# Patient Record
Sex: Male | Born: 1975 | Race: White | Hispanic: No | Marital: Married | Smoking: Former smoker
Health system: Southern US, Community
[De-identification: ages and names within clinical notes are randomized; demographics above are authoritative.]

## PROBLEM LIST (undated history)

## (undated) HISTORY — PX: MASS EXCISION: SHX2000

---

## 2003-01-23 ENCOUNTER — Encounter: Payer: Self-pay | Admitting: Emergency Medicine

## 2003-01-23 ENCOUNTER — Emergency Department (HOSPITAL_COMMUNITY): Admission: EM | Admit: 2003-01-23 | Discharge: 2003-01-23 | Payer: Self-pay | Admitting: Emergency Medicine

## 2005-05-18 ENCOUNTER — Emergency Department (HOSPITAL_COMMUNITY): Admission: EM | Admit: 2005-05-18 | Discharge: 2005-05-18 | Payer: Self-pay | Admitting: Family Medicine

## 2006-05-14 ENCOUNTER — Emergency Department (HOSPITAL_COMMUNITY): Admission: EM | Admit: 2006-05-14 | Discharge: 2006-05-14 | Payer: Self-pay | Admitting: Emergency Medicine

## 2006-10-09 IMAGING — CR DG RIBS W/ CHEST 3+V*L*
3 series · 3 of 3 positions shown · non-contrast
Comparison: none

CLINICAL DATA: Acute injury with left lower rib pain and pleuritic chest pain.
 LEFT RIBS WITH PA CHEST ? 3 VIEWS ? 05/18/05:

[view not recorded (1 of 3)]
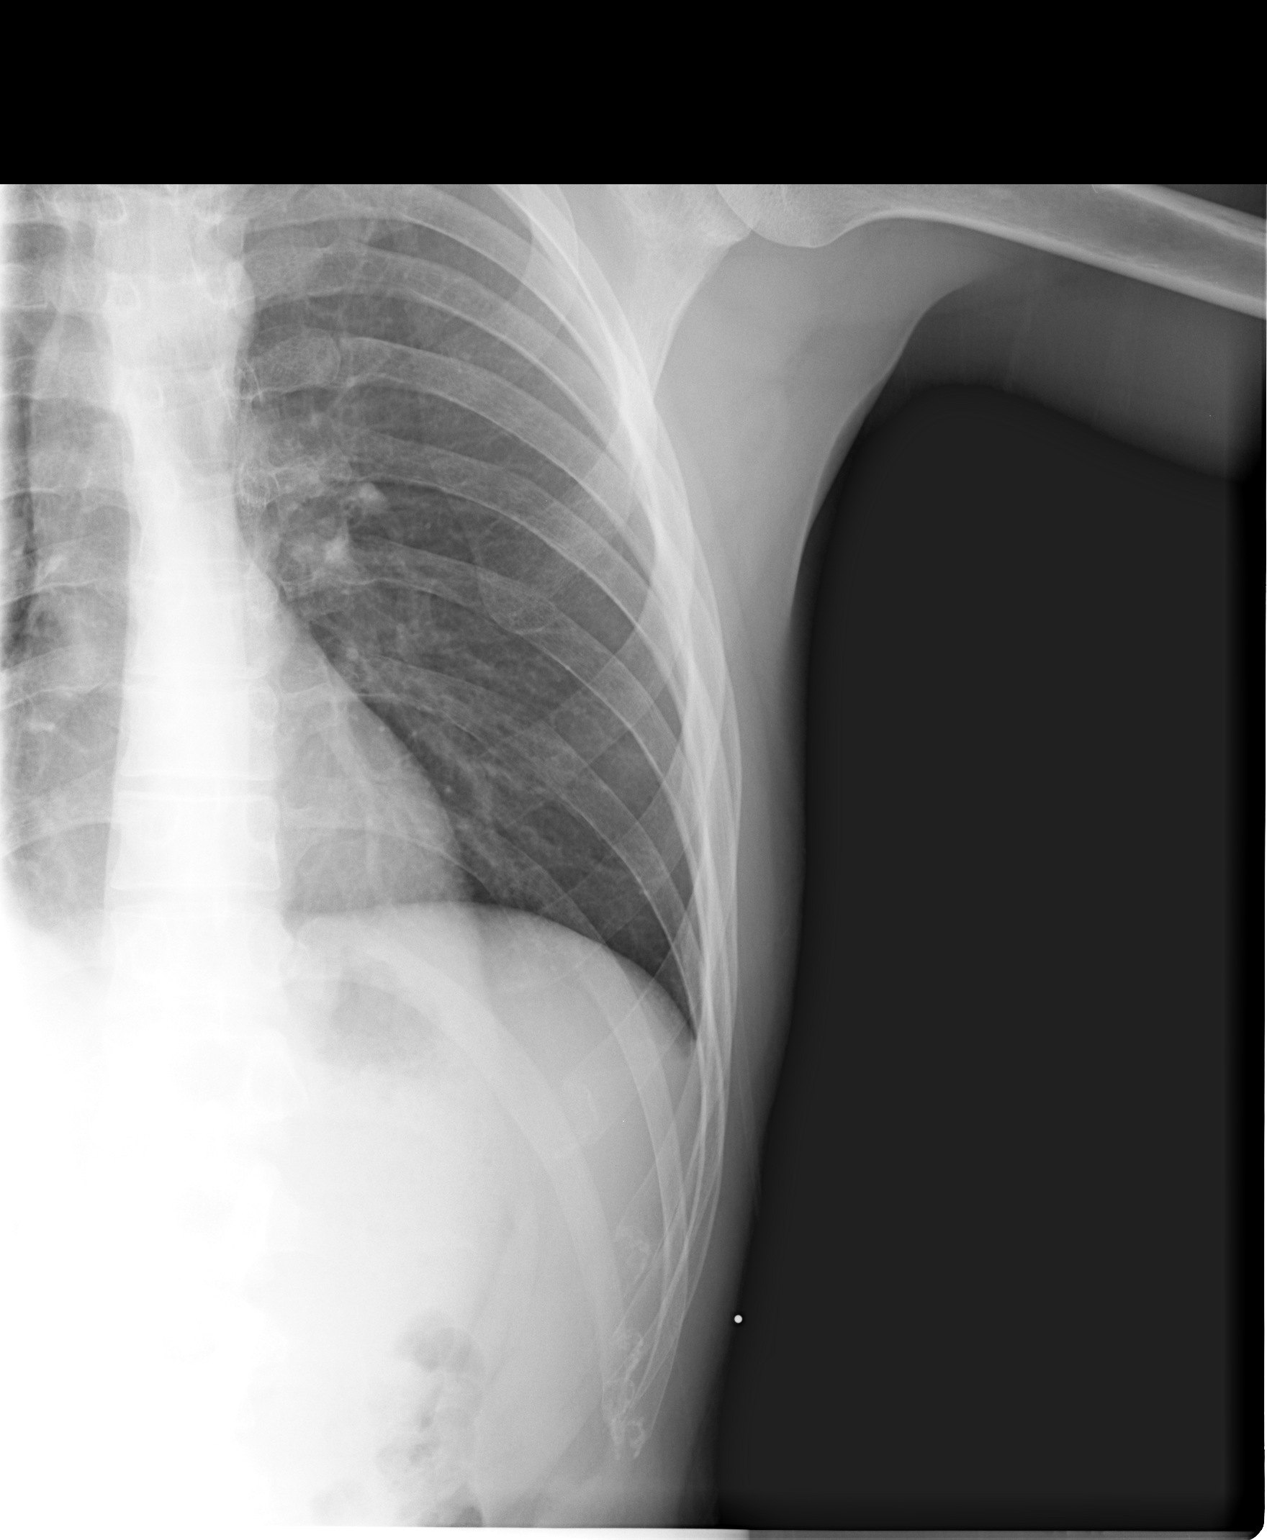

[view not recorded (2 of 3)]
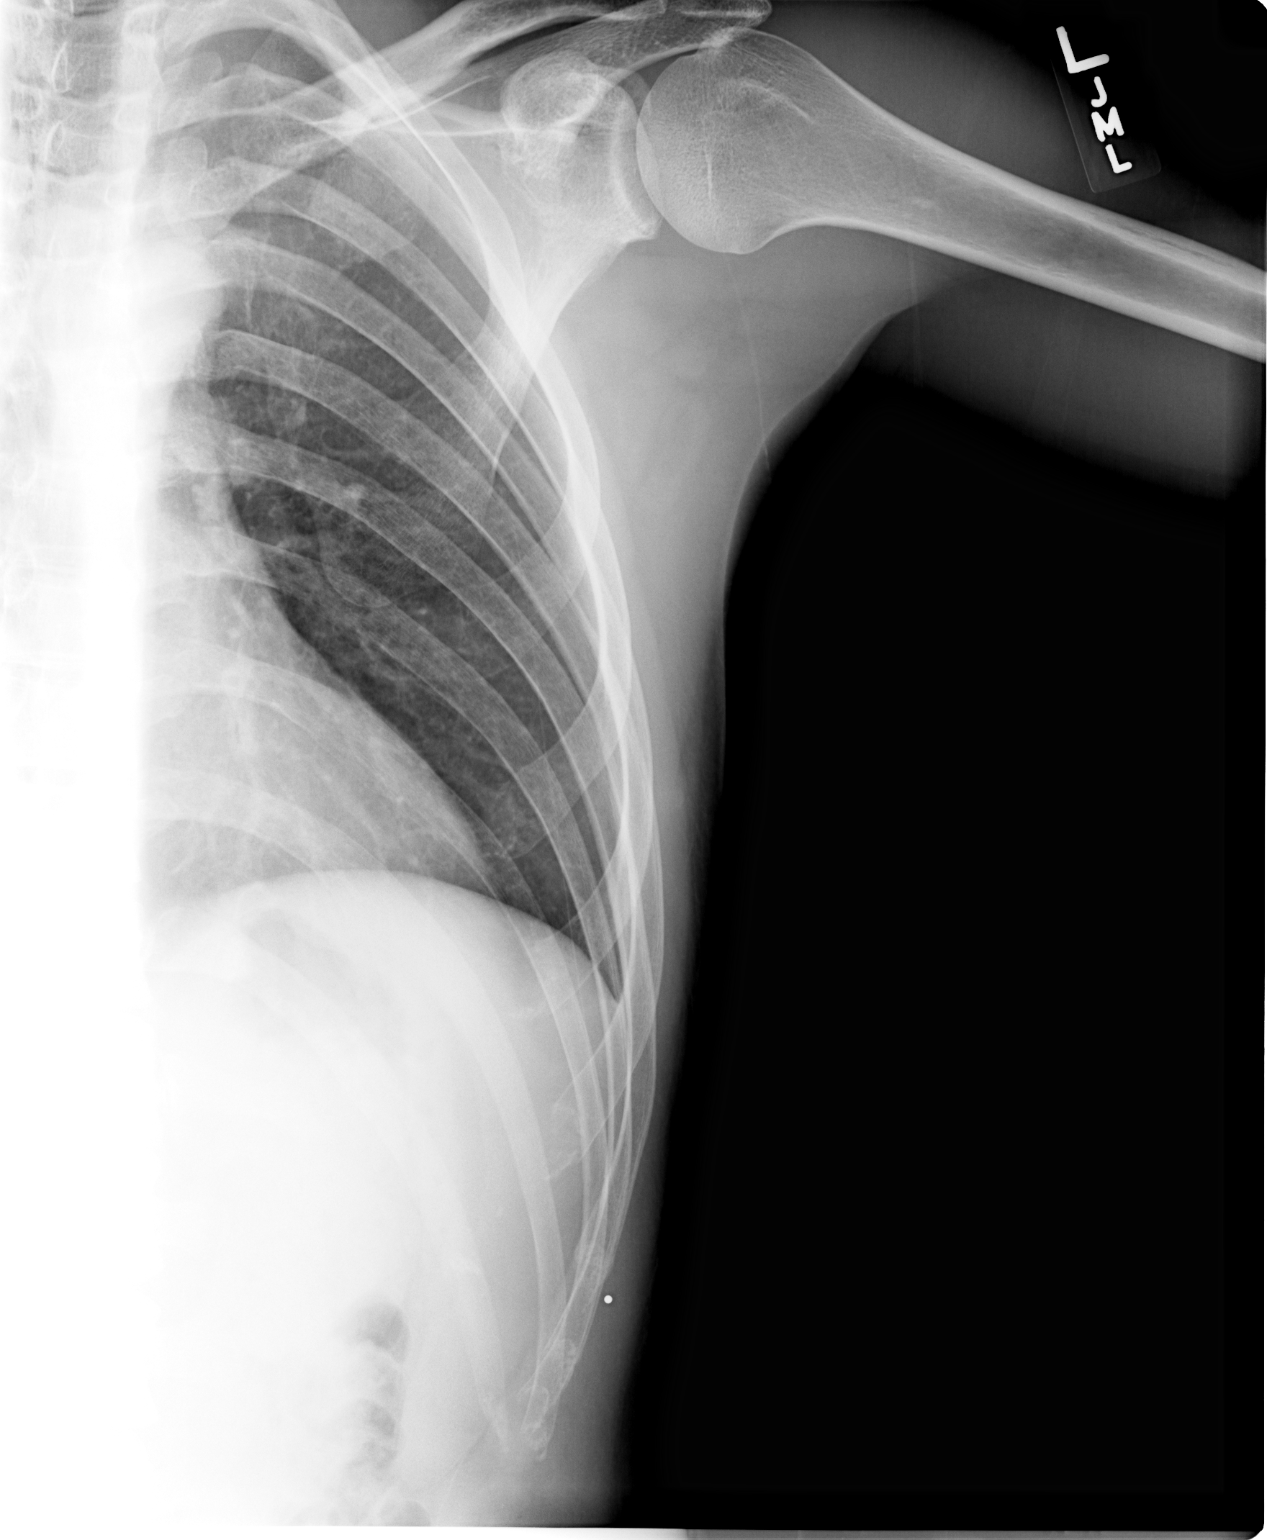

[view not recorded (3 of 3)]
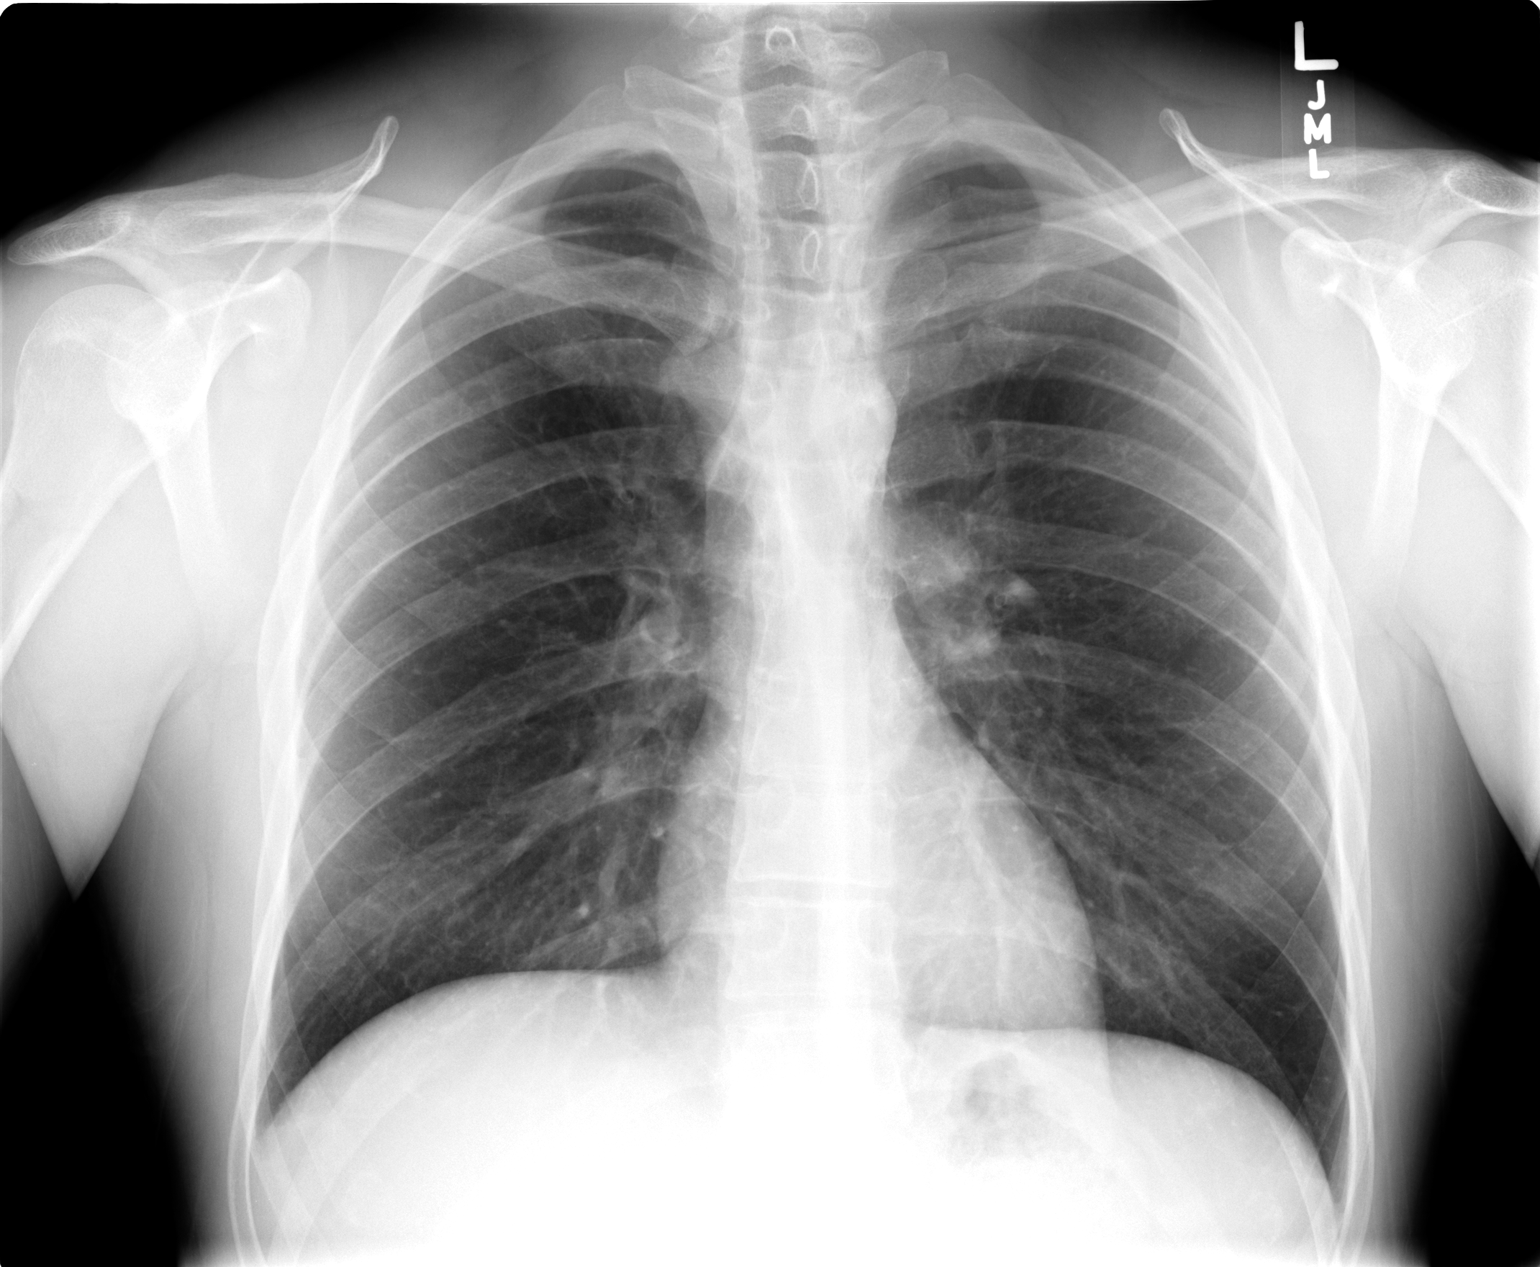

[3 of 3 positions shown; findings below may reference images not displayed]

FINDINGS: Frontal view of the chest as well as additional views of the left ribs demonstrate a probable nondisplaced fracture involving the distal aspect of the left ninth rib.  No associated pneumothorax or hemothorax.  No other abnormality.
IMPRESSION: Probable nondisplaced fracture involving the distal left ninth rib.  No pneumothorax.

## 2006-10-26 ENCOUNTER — Emergency Department (HOSPITAL_COMMUNITY): Admission: EM | Admit: 2006-10-26 | Discharge: 2006-10-26 | Payer: Self-pay | Admitting: Emergency Medicine

## 2009-04-23 ENCOUNTER — Emergency Department (HOSPITAL_COMMUNITY): Admission: EM | Admit: 2009-04-23 | Discharge: 2009-04-23 | Payer: Self-pay | Admitting: Emergency Medicine

## 2010-09-14 IMAGING — CR DG CHEST 2V
2 series · 2 of 2 positions shown · non-contrast
Comparison: 05/18/2005

CLINICAL DATA: Chest pain.  Smoker.

CHEST - 2 VIEW

[w chest pa]
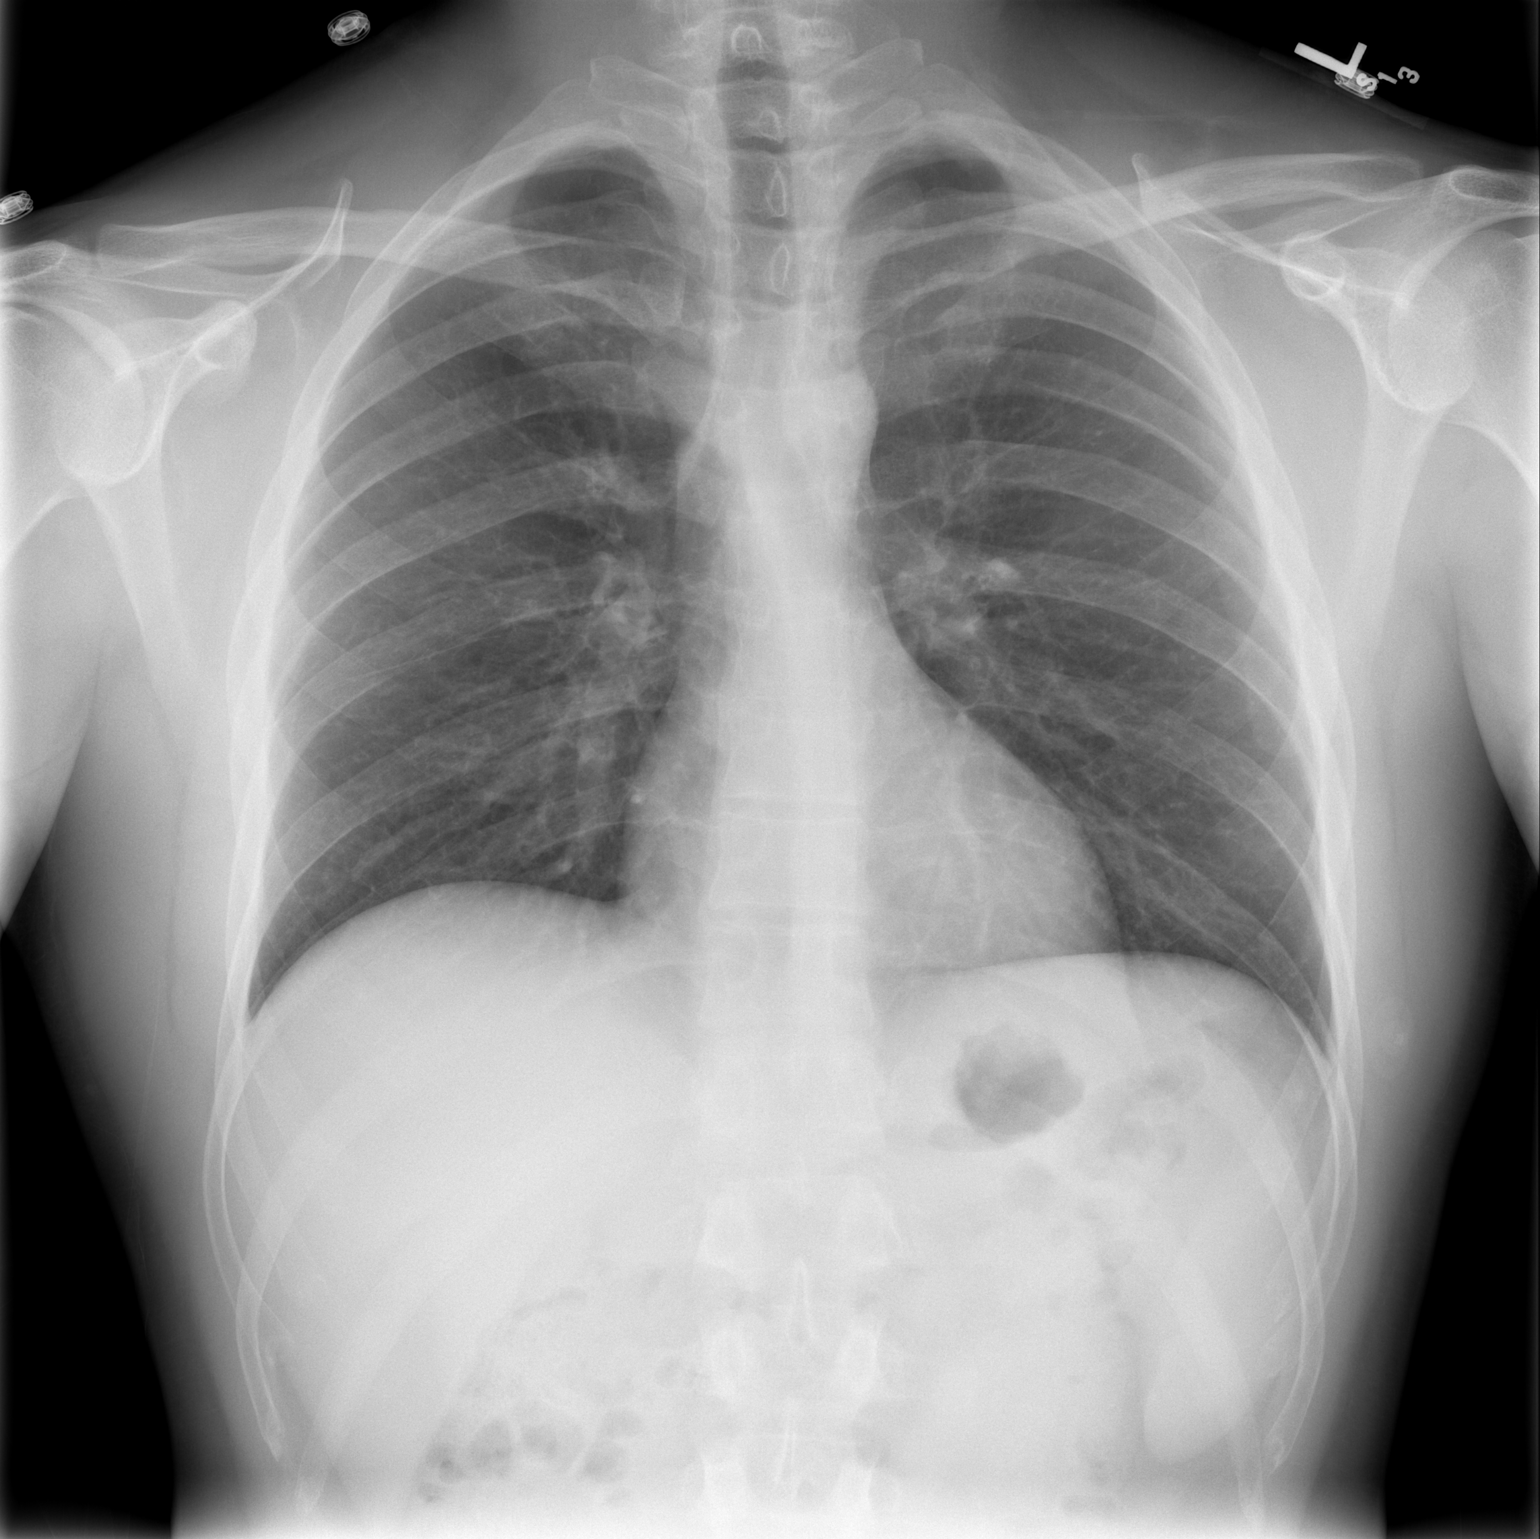

[w chest lat]
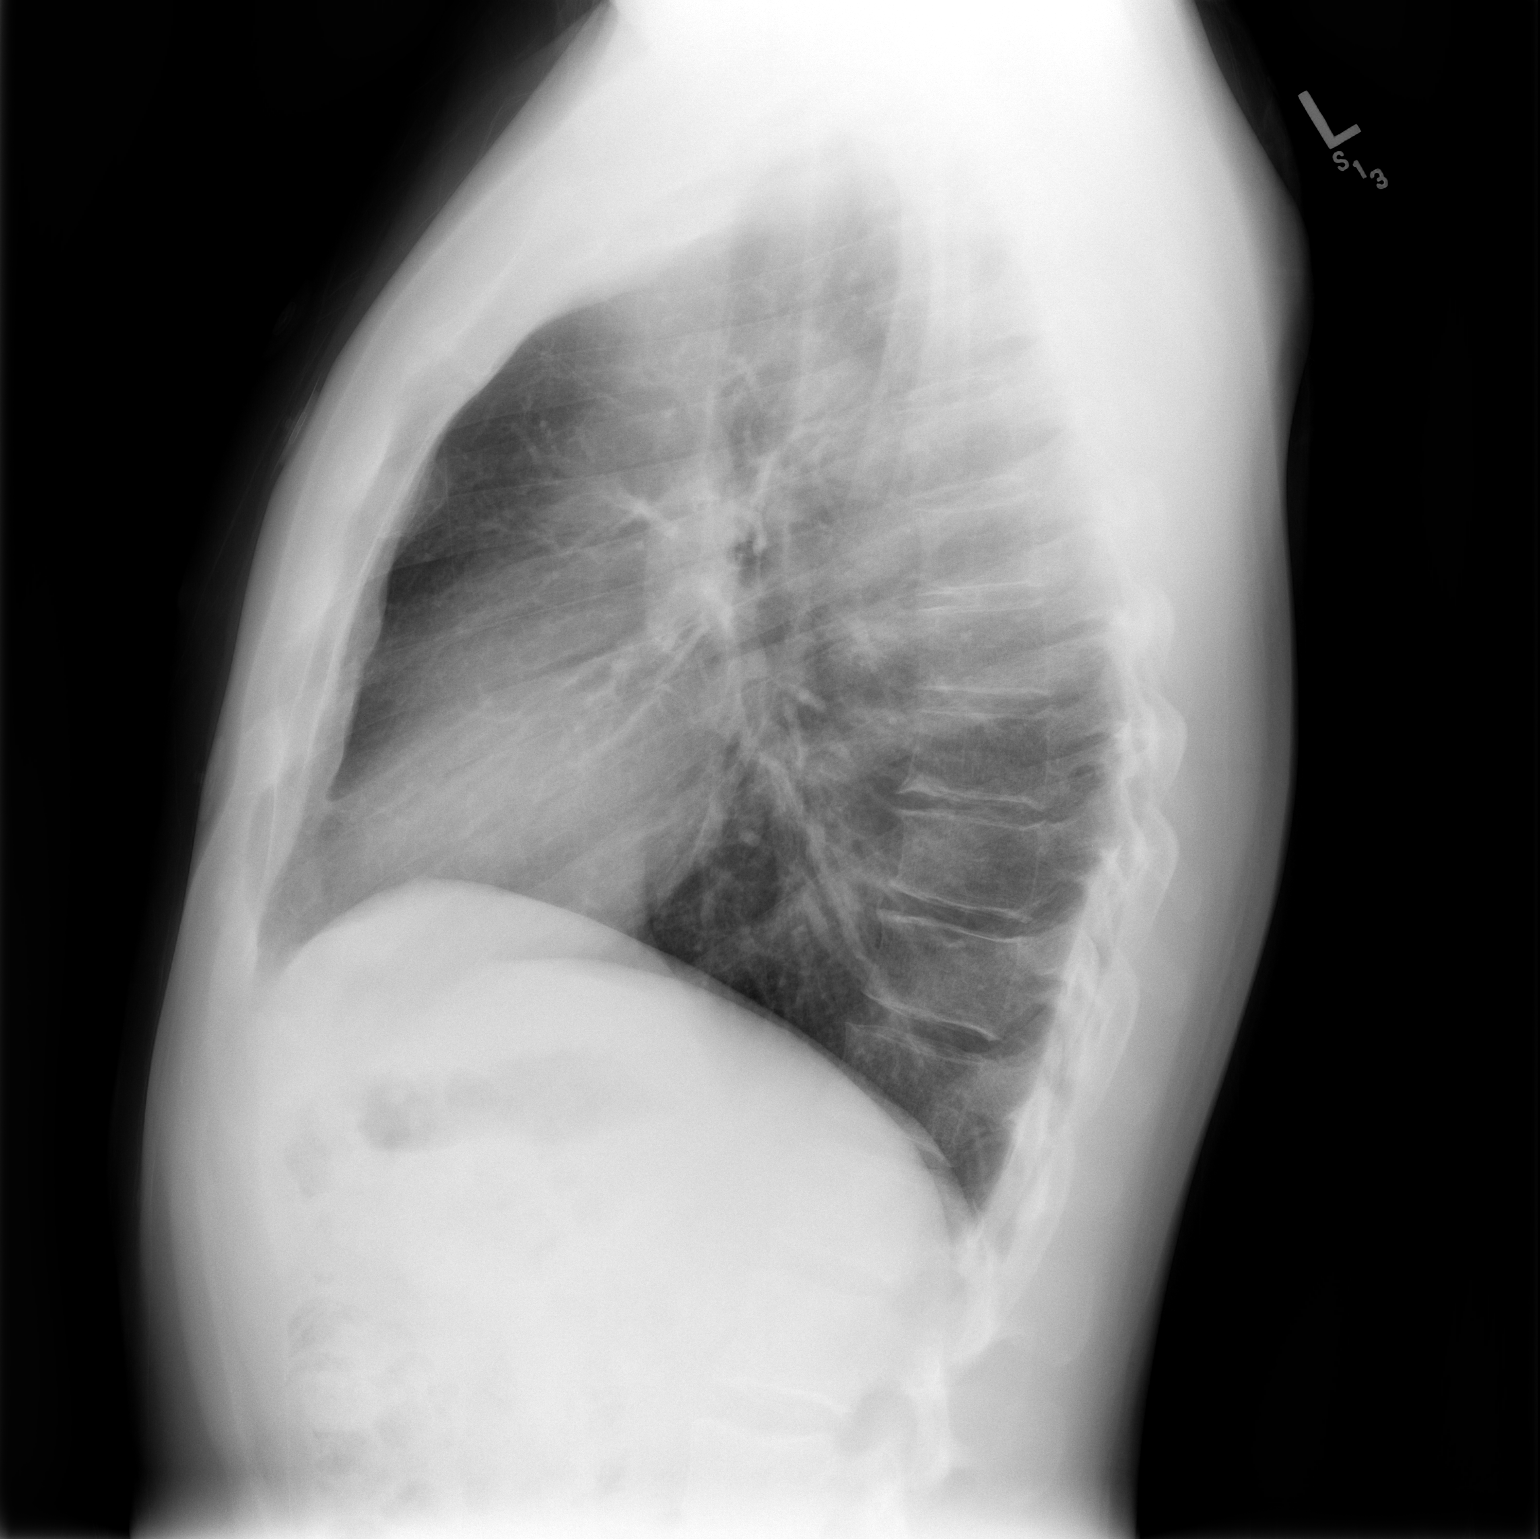

[2 of 2 positions shown; findings below may reference images not displayed]

FINDINGS: The heart size and mediastinal contours are within normal
limits.  Both lungs are clear.
IMPRESSION: No active disease.

## 2010-11-14 LAB — CBC
HCT: 47 % (ref 39.0–52.0)
Hemoglobin: 16.1 g/dL (ref 13.0–17.0)
MCHC: 34.3 g/dL (ref 30.0–36.0)
MCV: 90 fL (ref 78.0–100.0)
Platelets: 187 10*3/uL (ref 150–400)
RBC: 5.22 MIL/uL (ref 4.22–5.81)
RDW: 13.3 % (ref 11.5–15.5)
WBC: 7.1 10*3/uL (ref 4.0–10.5)

## 2010-11-14 LAB — POCT I-STAT, CHEM 8
BUN: 5 mg/dL — ABNORMAL LOW (ref 6–23)
Calcium, Ion: 1.15 mmol/L (ref 1.12–1.32)
Chloride: 105 mEq/L (ref 96–112)
Creatinine, Ser: 0.8 mg/dL (ref 0.4–1.5)
Glucose, Bld: 91 mg/dL (ref 70–99)
HCT: 49 % (ref 39.0–52.0)
Hemoglobin: 16.7 g/dL (ref 13.0–17.0)
Potassium: 4.1 mEq/L (ref 3.5–5.1)
Sodium: 139 mEq/L (ref 135–145)
TCO2: 24 mmol/L (ref 0–100)

## 2010-11-14 LAB — POCT CARDIAC MARKERS
CKMB, poc: <1 ng/mL — ABNORMAL LOW (ref 1.0–8.0)
CKMB, poc: <1 ng/mL — ABNORMAL LOW (ref 1.0–8.0)
Myoglobin, poc: 52.7 ng/mL (ref 12–200)
Myoglobin, poc: 66.8 ng/mL (ref 12–200)
Troponin i, poc: <0.05 ng/mL (ref 0.00–0.09)
Troponin i, poc: <0.05 ng/mL (ref 0.00–0.09)

## 2010-11-14 LAB — DIFFERENTIAL
Basophils Absolute: 0 10*3/uL (ref 0.0–0.1)
Basophils Relative: 1 % (ref 0–1)
Eosinophils Absolute: 0.1 10*3/uL (ref 0.0–0.7)
Eosinophils Relative: 2 % (ref 0–5)
Lymphocytes Relative: 32 % (ref 12–46)
Lymphs Abs: 2.3 10*3/uL (ref 0.7–4.0)
Monocytes Absolute: 0.5 10*3/uL (ref 0.1–1.0)
Monocytes Relative: 7 % (ref 3–12)
Neutro Abs: 4.1 10*3/uL (ref 1.7–7.7)
Neutrophils Relative %: 59 % (ref 43–77)

## 2013-09-20 ENCOUNTER — Ambulatory Visit (INDEPENDENT_AMBULATORY_CARE_PROVIDER_SITE_OTHER): Payer: 59 | Admitting: General Surgery

## 2013-09-20 ENCOUNTER — Encounter (INDEPENDENT_AMBULATORY_CARE_PROVIDER_SITE_OTHER): Payer: Self-pay | Admitting: General Surgery

## 2013-09-20 VITALS — BP 120/80 | HR 80 | Temp 98.4°F | Resp 14 | Ht 72.0 in | Wt 178.6 lb

## 2013-09-20 DIAGNOSIS — D1779 Benign lipomatous neoplasm of other sites: Secondary | ICD-10-CM

## 2013-09-20 DIAGNOSIS — D171 Benign lipomatous neoplasm of skin and subcutaneous tissue of trunk: Secondary | ICD-10-CM | POA: Insufficient documentation

## 2013-09-20 NOTE — Progress Notes (Signed)
Patient ID: Mario White, male   DOB: 01/09/1976, 38 y.o.   MRN: [id number]  Chief Complaint  Patient presents with  . 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 male.   HPI  He is referred by Dr. Brigitte Pulse for further evaluation and treatment of a mass in the left upper back. She states this started many years ago very small but has slowly grown over time. He saw Dr. Manuella Ghazi in the office. She made a small incision to see if she could excise it but it was too large. The incision has since healed. He says sometimes it causes him some discomfort. He is here now to discuss removal. He denies having these in any other area on his body. No family history of lipomas to his knowledge.  He denies significant trauma to the area.  History reviewed. No pertinent past medical history.  History reviewed. No pertinent past surgical history.  Family History  Problem Relation Age of Onset  . Cancer Father     skin on face    Social History History  Substance Use Topics  . Smoking status: Former Smoker    Quit date: 07/17/2013  . Smokeless tobacco: Never Used  . Alcohol Use: Yes     Comment: occsaional beer    Not on File  No current outpatient prescriptions on file.   No current facility-administered medications for this visit.    Review of Systems Review of Systems  All other systems reviewed and are negative.    Blood pressure 120/80, pulse 80, temperature 98.4 F (36.9 C), temperature source Oral, resp. rate 14, height 6' (1.829 m), weight 178 lb 9.6 oz (81.012 kg).  Physical Exam Physical Exam  Constitutional: He appears well-developed and well-nourished. No distress.  Musculoskeletal:  6 cm subcutaneous mobile soft tissue mass left upper back. A small scar directly over this. No erythema to    Data Reviewed Note from Dr. Brigitte Pulse.  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 Given that it is  getting larger however removal is indicated.     Plan    Excision of subcutaneous soft tissue mass under local anesthesia. We have discussed the procedure and the risks. Risks include but not limited to bleeding, infection, wound healing problems, recurrence. He seems to understand this and would like to proceed.        Kayin Osment J 09/20/2013, 12:47 PM

## 2013-09-20 NOTE — Patient Instructions (Signed)
We will schedule outpatient surgery for you.

## 2013-09-26 ENCOUNTER — Other Ambulatory Visit (INDEPENDENT_AMBULATORY_CARE_PROVIDER_SITE_OTHER): Payer: Self-pay | Admitting: *Deleted

## 2013-09-26 ENCOUNTER — Other Ambulatory Visit (INDEPENDENT_AMBULATORY_CARE_PROVIDER_SITE_OTHER): Payer: Self-pay | Admitting: General Surgery

## 2013-09-26 DIAGNOSIS — D1739 Benign lipomatous neoplasm of skin and subcutaneous tissue of other sites: Secondary | ICD-10-CM

## 2013-09-26 MED ORDER — HYDROCODONE-ACETAMINOPHEN 5-325 MG PO TABS: 1.0000 | ORAL_TABLET | ORAL | Status: DC | PRN

## 2013-09-28 ENCOUNTER — Telehealth (INDEPENDENT_AMBULATORY_CARE_PROVIDER_SITE_OTHER): Payer: Self-pay

## 2013-09-28 NOTE — Telephone Encounter (Signed)
Pt returned call and was given his pathology.

## 2013-09-28 NOTE — Telephone Encounter (Signed)
Pathology shows benign fibroadenoma.

## 2013-10-13 ENCOUNTER — Ambulatory Visit (INDEPENDENT_AMBULATORY_CARE_PROVIDER_SITE_OTHER): Payer: 59 | Admitting: General Surgery

## 2013-10-13 ENCOUNTER — Encounter (INDEPENDENT_AMBULATORY_CARE_PROVIDER_SITE_OTHER): Payer: Self-pay | Admitting: General Surgery

## 2013-10-13 VITALS — BP 122/82 | HR 70 | Temp 98.6°F | Resp 16 | Ht 71.0 in | Wt 182.2 lb

## 2013-10-13 DIAGNOSIS — Z4889 Encounter for other specified surgical aftercare: Secondary | ICD-10-CM

## 2013-10-13 NOTE — Patient Instructions (Signed)
Applied a thin coat of Neosporin and a dry dressing to the area daily for one week.

## 2013-10-13 NOTE — Progress Notes (Signed)
He is here for his first postoperative visit after excision of a soft tissue mass in the left shoulder area 09/26/2013. 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 He thinks he feels a stitch somewhere in the wound.  On exam the wound is clean and intact. It is dry with some scab formation. I applied a thin coat of Neosporin to it though by dry dressing.  Assessment-pathology is benign and wound is healing okay.   Plan:  Apply Neosporin to the wound followed by dry dressing daily for one week. Return visit as needed. We discussed this pathology.

## 2014-01-02 ENCOUNTER — Ambulatory Visit (INDEPENDENT_AMBULATORY_CARE_PROVIDER_SITE_OTHER): Payer: 59 | Admitting: Neurology

## 2014-01-02 ENCOUNTER — Ambulatory Visit (INDEPENDENT_AMBULATORY_CARE_PROVIDER_SITE_OTHER): Payer: Self-pay

## 2014-01-02 DIAGNOSIS — R209 Unspecified disturbances of skin sensation: Secondary | ICD-10-CM

## 2014-01-02 DIAGNOSIS — Z0289 Encounter for other administrative examinations: Secondary | ICD-10-CM

## 2014-01-02 NOTE — Procedures (Signed)
     HISTORY:  Mario White is a 38 year old done with a 4-5 year history of pain in the knuckles of the hands bilaterally. The patient has some tingly sensations in the hands, and some discomfort to the right elbow. He denies neck pain or pain radiating down the arms. The patient is being evaluated for possible neuropathy or a cervical radiculopathy.  NERVE CONDUCTION STUDIES:  Nerve conduction studies were performed on both upper extremities. The distal motor latencies and motor amplitudes for the median and ulnar nerves were within normal limits. The F wave latencies and nerve conduction velocities for these nerves were also normal. The sensory latencies for the median and ulnar nerves were normal.   EMG STUDIES:  EMG study was performed on the right upper extremity:  The first dorsal interosseous muscle reveals 2 to 4 K units with full recruitment. No fibrillations or positive waves were noted. The abductor pollicis brevis muscle reveals 2 to 4 K units with full recruitment. No fibrillations or positive waves were noted. The extensor indicis proprius muscle reveals 1 to 3 K units with full recruitment. No fibrillations or positive waves were noted. The pronator teres muscle reveals 2 to 3 K units with full recruitment. No fibrillations or positive waves were noted. The biceps muscle reveals 1 to 2 K units with full recruitment. No fibrillations or positive waves were noted. The triceps muscle reveals 2 to 4 K units with full recruitment. No fibrillations or positive waves were noted. The anterior deltoid muscle reveals 2 to 3 K units with full recruitment. No fibrillations or positive waves were noted. The cervical paraspinal muscles were tested at 2 levels. No abnormalities of insertional activity were seen at either level tested. There was good relaxation.  EMG study was performed on the left upper extremity:  The first dorsal interosseous muscle reveals 2 to 4 K units with full  recruitment. No fibrillations or positive waves were noted. The abductor pollicis brevis muscle reveals 2 to 4 K units with full recruitment. No fibrillations or positive waves were noted. The extensor indicis proprius muscle reveals 1 to 3 K units with full recruitment. No fibrillations or positive waves were noted. The pronator teres muscle reveals 2 to 3 K units with full recruitment. No fibrillations or positive waves were noted. The biceps muscle reveals 1 to 2 K units with full recruitment. No fibrillations or positive waves were noted. The triceps muscle reveals 2 to 4 K units with full recruitment. No fibrillations or positive waves were noted. The anterior deltoid muscle reveals 2 to 3 K units with full recruitment. No fibrillations or positive waves were noted. The cervical paraspinal muscles were tested at 2 levels. No abnormalities of insertional activity were seen at either level tested. There was good relaxation.   IMPRESSION:  Nerve conduction studies done on both upper extremities were within normal limits. No evidence of carpal tunnel syndrome seen. EMG evaluation of both upper extremities were unremarkable, without evidence of an overlying cervical radiculopathy on either side.  Jill Alexanders MD 01/02/2014 4:14 PM  [facility] [address] [place], [place] [address]  Phone [phone] Fax [phone]

## 2014-08-15 NOTE — Telephone Encounter (Signed)
Erroneous Encounter

## 2014-12-21 DIAGNOSIS — Y9289 Other specified places as the place of occurrence of the external cause: Secondary | ICD-10-CM | POA: Diagnosis not present

## 2014-12-21 DIAGNOSIS — S61251A Open bite of left index finger without damage to nail, initial encounter: Secondary | ICD-10-CM | POA: Insufficient documentation

## 2014-12-21 DIAGNOSIS — Y9389 Activity, other specified: Secondary | ICD-10-CM | POA: Diagnosis not present

## 2014-12-21 DIAGNOSIS — W540XXA Bitten by dog, initial encounter: Secondary | ICD-10-CM | POA: Diagnosis not present

## 2014-12-21 DIAGNOSIS — Y998 Other external cause status: Secondary | ICD-10-CM | POA: Insufficient documentation

## 2014-12-21 DIAGNOSIS — Z23 Encounter for immunization: Secondary | ICD-10-CM | POA: Insufficient documentation

## 2014-12-21 DIAGNOSIS — Z87891 Personal history of nicotine dependence: Secondary | ICD-10-CM | POA: Insufficient documentation

## 2014-12-22 ENCOUNTER — Emergency Department
Admission: EM | Admit: 2014-12-22 | Discharge: 2014-12-22 | Disposition: A | Discharge status: Home / Self Care | Payer: 59 | Attending: Emergency Medicine | Admitting: Emergency Medicine

## 2014-12-22 DIAGNOSIS — S6992XA Unspecified injury of left wrist, hand and finger(s), initial encounter: Secondary | ICD-10-CM

## 2014-12-22 DIAGNOSIS — W540XXA Bitten by dog, initial encounter: Secondary | ICD-10-CM

## 2014-12-22 DIAGNOSIS — Z5189 Encounter for other specified aftercare: Secondary | ICD-10-CM

## 2014-12-22 MED ORDER — BACITRACIN 500 UNIT/GM EX OINT
1.0000 "application " | TOPICAL_OINTMENT | Freq: Once | CUTANEOUS | Status: AC
  Administered 2014-12-22: 1 via TOPICAL

## 2014-12-22 MED ORDER — TETANUS-DIPHTH-ACELL PERTUSSIS 5-2.5-18.5 LF-MCG/0.5 IM SUSP
INTRAMUSCULAR | Status: AC
  Filled 2014-12-22: qty 0.5

## 2014-12-22 MED ORDER — AMOXICILLIN-POT CLAVULANATE 875-125 MG PO TABS
ORAL_TABLET | ORAL | Status: AC
  Filled 2014-12-22: qty 1

## 2014-12-22 MED ORDER — AMOXICILLIN-POT CLAVULANATE 875-125 MG PO TABS
1.0000 | ORAL_TABLET | Freq: Once | ORAL | Status: AC
  Administered 2014-12-22: 1 via ORAL

## 2014-12-22 MED ORDER — TETANUS-DIPHTH-ACELL PERTUSSIS 5-2.5-18.5 LF-MCG/0.5 IM SUSP
0.5000 mL | Freq: Once | INTRAMUSCULAR | Status: AC
  Administered 2014-12-22: 0.5 mL via INTRAMUSCULAR

## 2014-12-22 MED ORDER — BACITRACIN ZINC 500 UNIT/GM EX OINT
TOPICAL_OINTMENT | CUTANEOUS | Status: AC
  Filled 2014-12-22: qty 0.9

## 2014-12-22 MED ORDER — AMOXICILLIN-POT CLAVULANATE 875-125 MG PO TABS: 1.0000 | ORAL_TABLET | Freq: Two times a day (BID) | ORAL | Status: DC

## 2014-12-22 NOTE — ED Notes (Signed)
Animal control form filled out and faxed to [employer] as requested by ryan at [employer].

## 2014-12-22 NOTE — ED Notes (Signed)
Spoke with ryan at [place] to have animal control of [place] paged regarding dog bite. Thurmond Butts states "you need to fill out the form if and fax it to [facility]".

## 2014-12-22 NOTE — ED Provider Notes (Signed)
[facility] Emergency Department Provider Note  ____________________________________________  Time seen: Approximately 5:58 AM  I have reviewed the triage vital signs and the nursing notes.   HISTORY  Chief Complaint Laceration    HPI Mario White is a 39 y.o. male who presents with dog bite to left index finger which occurred prior to arrival. Patient was separating his dog from the neighbor's dog because they were fighting. Patient grabbed his dog by the snout and was bitten. Animal's immunization status is up to date. Appearance of animal appeared well. Animal is known; can be observed for 10 days. Animal control notified by RN. Patient currently denies pain. Denies other injuries. Denies numbness/tingling to finger.   No past medical history on file.  No history of diabetes  There are no active problems to display for this patient.  Tetanus not up-to-date; given in ED.  Past Surgical History  Procedure Laterality Date  . Mass excision      let shoulder benign lipoma    Current Outpatient Rx  Name  Route  Sig  Dispense  Refill  . amoxicillin-clavulanate (AUGMENTIN) 875-125 MG per tablet   Oral   Take 1 tablet by mouth 2 (two) times daily.   20 tablet   0     Allergies Review of patient's allergies indicates no known allergies.  Family History  Problem Relation Age of Onset  . Cancer Father     skin on face    Social History History  Substance Use Topics  . Smoking status: Former Smoker    Quit date: 07/17/2013  . Smokeless tobacco: Never Used  . Alcohol Use: Yes     Comment: occsaional beer    Review of Systems Constitutional: No fever/chills Eyes: No visual changes. ENT: No sore throat. Cardiovascular: Denies chest pain. Respiratory: Denies shortness of breath. Gastrointestinal: No abdominal pain.  No nausea, no vomiting.  No diarrhea.  No constipation. Genitourinary: Negative for dysuria. Musculoskeletal: Positive for  left second finger injury Negative for back pain. Skin: Negative for rash. Neurological: Negative for headaches, focal weakness or numbness/tingling.  10-point ROS otherwise negative.  ____________________________________________   PHYSICAL EXAM:  VITAL SIGNS: ED Triage Vitals  Enc Vitals Group     BP 12/22/14 0018 140/77 mmHg     Pulse Rate 12/22/14 0018 83     Resp 12/22/14 0018 18     Temp 12/22/14 0018 97.7 F (36.5 C)     Temp Source 12/22/14 0018 Oral     SpO2 12/22/14 0018 100 %     Weight 12/22/14 0018 183 lb (83.008 kg)     Height 12/22/14 0018 5\' 10"  (1.778 m)     Head Cir --      Peak Flow --      Pain Score 12/22/14 0018 0     Pain Loc --      Pain Edu? --      Excl. in [place]? --     Constitutional: Alert and oriented. Well appearing and in no acute distress. Eyes: Conjunctivae are normal. PERRL. EOMI. Head: Atraumatic. Nose: No congestion/rhinnorhea. Mouth/Throat: Mucous membranes are moist.  Oropharynx non-erythematous. Neck: No stridor.   Cardiovascular: Normal rate, regular rhythm. Grossly normal heart sounds.  Good peripheral circulation. Respiratory: Normal respiratory effort.  No retractions. Lungs CTAB. Gastrointestinal: Soft and nontender. No distention. No abdominal bruits. No CVA tenderness. Musculoskeletal: Left second digit: Approximately 1 cm linear, non-gaping laceration to finger pad. No active bleeding. No tendon injury visualized.  No foreign body noted. 2+ radial pulse. Brisk capillary refill. Full range of motion. No lower extremity tenderness nor edema.  No joint effusions. Neurologic:  Normal speech and language. No gross focal neurologic deficits are appreciated. Speech is normal. No gait instability. No sensory deficits to left index finger. Skin:  Skin is warm, dry and intact. No rash noted. Psychiatric: Mood and affect are normal. Speech and behavior are normal.  ____________________________________________   LABS (all labs ordered are  listed, but only abnormal results are displayed)  Labs Reviewed - No data to display ____________________________________________  EKG  None ____________________________________________  RADIOLOGY  None ____________________________________________   PROCEDURES  Procedure(s) performed: None  Critical Care performed: No  ____________________________________________   INITIAL IMPRESSION / ASSESSMENT AND PLAN / ED COURSE  Pertinent labs & imaging results that were available during my care of the patient were reviewed by me and considered in my medical decision making (see chart for details).  39 year old male with dog bite to left index finger over 6 hours ago. Plan to update tetanus shot, soak finger in Betadine/saline solution. Wound care to finger. Discussed with patient and family increased risk of infection if sutures are applied to wound given the contaminated nature of dog bite, especially on an injury that is greater than 6 hours old.. Will start empiric antibiotic with Augmentin. Close follow-up either with orthopedics or return to ER for wound check in 2 days. Strict return precautions given. Both verbalize understanding and agree with plan of care. ____________________________________________   FINAL CLINICAL IMPRESSION(S) / ED DIAGNOSES  Final diagnoses:  Dog bite  Finger injury, left, initial encounter  Encounter for wound care      Paulette Blanch, MD 12/22/14 [id number]

## 2014-12-22 NOTE — Discharge Instructions (Signed)
1. Take antibiotic as prescribed (Augmentin 875 mg by mouth twice a day 10 days). 2. Keep wound clean and dry. 3. Wound check in 2 days, either with orthopedics or return to ER. 4. Return to the ER for worsening symptoms, fever, persistent vomiting, increased redness, swelling, purulent discharge or other concerns.  Animal Bite An animal bite can result in a scratch on the skin, deep open cut, puncture of the skin, crush injury, or tearing away of the skin or a body part. Dogs are responsible for most animal bites. Children are bitten more often than adults. An animal bite can range from very mild to more serious. A small bite from your house pet is no cause for alarm. However, some animal bites can become infected or injure a bone or other tissue. You must seek medical care if:  The skin is broken and bleeding does not slow down or stop after 15 minutes.  The puncture is deep and difficult to clean (such as a cat bite).  Pain, warmth, redness, or pus develops around the wound.  The bite is from a stray animal or rodent. There may be a risk of rabies infection.  The bite is from a snake, raccoon, skunk, fox, coyote, or bat. There may be a risk of rabies infection.  The person bitten has a chronic illness such as diabetes, liver disease, or cancer, or the person takes medicine that lowers the immune system.  There is concern about the location and severity of the bite. It is important to clean and protect an animal bite wound right away to prevent infection. Follow these steps:  Clean the wound with plenty of water and soap.  Apply an antibiotic cream.  Apply gentle pressure over the wound with a clean towel or gauze to slow or stop bleeding.  Elevate the affected area above the heart to help stop any bleeding.  Seek medical care. Getting medical care within 8 hours of the animal bite leads to the best possible outcome. DIAGNOSIS  Your caregiver will most likely:  Take a detailed  history of the animal and the bite injury.  Perform a wound exam.  Take your medical history. Blood tests or X-rays may be performed. Sometimes, infected bite wounds are cultured and sent to a lab to identify the infectious bacteria.  TREATMENT  Medical treatment will depend on the location and type of animal bite as well as the patient's medical history. Treatment may include:  Wound care, such as cleaning and flushing the wound with saline solution, bandaging, and elevating the affected area.  Antibiotics.  Tetanus immunization.  Rabies immunization.  Leaving the wound open to heal. This is often done with animal bites, due to the high risk of infection. However, in certain cases, wound closure with stitches, wound adhesive, skin adhesive strips, or staples may be used. Infected bites that are left untreated may require intravenous (IV) antibiotics and surgical treatment in the hospital. [place]  Follow your caregiver's instructions for wound care.  Take all medicines as directed.  If your caregiver prescribes antibiotics, take them as directed. Finish them even if you start to feel better.  Follow up with your caregiver for further exams or immunizations as directed. You may need a tetanus shot if:  You cannot remember when you had your last tetanus shot.  You have never had a tetanus shot.  The injury broke your skin. If you get a tetanus shot, your arm may swell, get red, and feel  warm to the touch. This is common and not a problem. If you need a tetanus shot and you choose not to have one, there is a rare chance of getting tetanus. Sickness from tetanus can be serious. SEEK MEDICAL CARE IF:  You notice warmth, redness, soreness, swelling, pus discharge, or a bad smell coming from the wound.  You have a red line on the skin coming from the wound.  You have a fever, chills, or a general ill feeling.  You have nausea or vomiting.  You have continued or  worsening pain.  You have trouble moving the injured part.  You have other questions or concerns. MAKE SURE YOU:  Understand these instructions.  Will watch your condition.  Will get help right away if you are not doing well or get worse. Document Released: 04/14/2011 Document Revised: 10/19/2011 Document Reviewed: 04/14/2011 [facility] Patient Information 2015 [place], [place]. This information is not intended to replace advice given to you by your health care provider. Make sure you discuss any questions you have with your health care provider.  Fingertip Injuries and Amputations Fingertip injuries are common and often get injured because they are last to escape when pulling your hand out of harm's way. You have amputated (cut off) part of your finger. How this turns out depends largely on how much was amputated. If just the tip is amputated, often the end of the finger will grow back and the finger may return to much the same as it was before the injury.  If more of the finger is missing, your caregiver has done the best with the tissue remaining to allow you to keep as much finger as is possible. Your caregiver after checking your injury has tried to leave you with a painless fingertip that has durable, feeling skin. If possible, your caregiver has tried to maintain the finger's length and appearance and preserve its fingernail.  Please read the instructions outlined below and refer to this sheet in the next few weeks. These instructions provide you with general information on caring for yourself. Your caregiver may also give you specific instructions. While your treatment has been done according to the most current medical practices available, unavoidable complications occasionally occur. If you have any problems or questions after discharge, please call your caregiver. HOME CARE INSTRUCTIONS   You may resume normal diet and activities as directed or allowed.  Keep your hand elevated above  the level of your heart. This helps decrease pain and swelling.  Keep ice packs (or a bag of ice wrapped in a towel) on the injured area for 15-20 minutes, 03-04 times per day, for the first two days.  Change dressings if necessary or as directed.  Clean the wound daily or as directed.  Only take over-the-counter or prescription medicines for pain, discomfort, or fever as directed by your caregiver.  Keep appointments as directed. SEEK IMMEDIATE MEDICAL CARE IF:  You develop redness, swelling, numbness or increasing pain in the wound.  There is pus coming from the wound.  You develop an unexplained oral temperature above 102 F (38.9 C) or as your caregiver suggests.  There is a foul (bad) smell coming from the wound or dressing.  There is a breaking open of the wound (edges not staying together) after sutures or staples have been removed. MAKE SURE YOU:   Understand these instructions.  Will watch your condition.  Will get help right away if you are not doing well or get worse. Document Released: 06/17/2005  Document Revised: 10/19/2011 Document Reviewed: 05/16/2008 [facility] Patient Information 2015 [place], [place]. This information is not intended to replace advice given to you by your health care provider. Make sure you discuss any questions you have with your health care provider.  Wound Care Wound care helps prevent pain and infection.  You may need a tetanus shot if:  You cannot remember when you had your last tetanus shot.  You have never had a tetanus shot.  The injury broke your skin. If you need a tetanus shot and you choose not to have one, you may get tetanus. Sickness from tetanus can be serious. HOME CARE   Only take medicine as told by your doctor.  Clean the wound daily with mild soap and water.  Change any bandages (dressings) as told by your doctor.  Put medicated cream and a bandage on the wound as told by your doctor.  Change the bandage if it  gets wet, dirty, or starts to smell.  Take showers. Do not take baths, swim, or do anything that puts your wound under water.  Rest and raise (elevate) the wound until the pain and puffiness (swelling) are better.  Keep all doctor visits as told. GET HELP RIGHT AWAY IF:   Yellowish-white fluid (pus) comes from the wound.  Medicine does not lessen your pain.  There is a red streak going away from the wound.  You have a fever. MAKE SURE YOU:   Understand these instructions.  Will watch your condition.  Will get help right away if you are not doing well or get worse. Document Released: 05/05/2008 Document Revised: 10/19/2011 Document Reviewed: 11/30/2010 [facility] Patient Information 2015 [place], [place]. This information is not intended to replace advice given to you by your health care provider. Make sure you discuss any questions you have with your health care provider.

## 2014-12-22 NOTE — ED Notes (Signed)
Pt with laceration to left index finger, was bit by his own dog.  Dog is up to date on all vaccines.

## 2017-10-02 ENCOUNTER — Encounter (HOSPITAL_COMMUNITY): Payer: Self-pay | Admitting: *Deleted

## 2017-10-02 ENCOUNTER — Other Ambulatory Visit: Payer: Self-pay

## 2017-10-02 ENCOUNTER — Ambulatory Visit (HOSPITAL_COMMUNITY)
Admission: EM | Admit: 2017-10-02 | Discharge: 2017-10-02 | Disposition: A | Discharge status: Home / Self Care | Payer: Commercial Managed Care - PPO

## 2017-10-02 DIAGNOSIS — R509 Fever, unspecified: Secondary | ICD-10-CM

## 2017-10-02 DIAGNOSIS — R69 Illness, unspecified: Principal | ICD-10-CM

## 2017-10-02 DIAGNOSIS — R5383 Other fatigue: Secondary | ICD-10-CM

## 2017-10-02 DIAGNOSIS — R05 Cough: Secondary | ICD-10-CM

## 2017-10-02 DIAGNOSIS — J111 Influenza due to unidentified influenza virus with other respiratory manifestations: Secondary | ICD-10-CM

## 2017-10-02 MED ORDER — OSELTAMIVIR PHOSPHATE 75 MG PO CAPS: 75.0000 mg | ORAL_CAPSULE | Freq: Two times a day (BID) | ORAL | 0 refills | Status: AC

## 2017-10-02 NOTE — ED Provider Notes (Signed)
[place]    CSN: [id number] Arrival date & time: 10/02/17  [place]     History   Chief Complaint Chief Complaint  Patient presents with  . Fever  . Generalized Body Aches  . Chills  . Cough  . Fatigue    HPI HASTON CASEBOLT is a 42 y.o. male.   [place] presents with complaints of sudden onset of cough, fevers, body aches, headache, which started today. Has been taking dayquil which has helped some with temp, last does prior to arrival. Denies gi/gu complaints. Did get a flu vaccine this season. Denies shortness of breath, without asthma history. No known ill contacts. Denies runny nose or sore throat. Pain is 8/10 generally.  ROS per HPI.       History reviewed. No pertinent past medical history.  There are no active problems to display for this patient.   Past Surgical History:  Procedure Laterality Date  . MASS EXCISION     let shoulder benign lipoma       Home Medications    Prior to Admission medications   Medication Sig Start Date End Date Taking? Authorizing Provider  aspirin EC 81 MG tablet Take 81 mg by mouth daily.   Yes [provider]  PRAVASTATIN SODIUM PO Take by mouth.   Yes [provider]  oseltamivir (TAMIFLU) 75 MG capsule Take 1 capsule (75 mg total) by mouth every 12 (twelve) hours for 5 days. 10/02/17 10/07/17  Zigmund Gottron, NP    Family History Family History  Problem Relation Age of Onset  . Cancer Father        skin on face    Social History Social History   Tobacco Use  . Smoking status: Former Smoker    Last attempt to quit: 07/17/2013    Years since quitting: 4.2  . Smokeless tobacco: Never Used  Substance Use Topics  . Alcohol use: Yes    Comment: occsaional beer  . Drug use: No     Allergies   Patient has no known allergies.   Review of Systems Review of Systems   Physical Exam Triage Vital Signs ED Triage Vitals  Enc Vitals Group     BP 10/02/17 2028 122/82     Pulse Rate  10/02/17 2028 95     Resp 10/02/17 2028 16     Temp 10/02/17 2028 (!) 100.8 F (38.2 C)     Temp Source 10/02/17 2028 Oral     SpO2 10/02/17 2028 97 %     Weight --      Height --      Head Circumference --      Peak Flow --      Pain Score 10/02/17 2040 8     Pain Loc --      Pain Edu? --      Excl. in [place]? --    No data found.  Updated Vital Signs BP 122/82 (BP Location: Right Arm)   Pulse 95   Temp (!) 100.8 F (38.2 C) (Oral)   Resp 16   SpO2 97%   Visual Acuity Right Eye Distance:   Left Eye Distance:   Bilateral Distance:    Right Eye Near:   Left Eye Near:    Bilateral Near:     Physical Exam  Constitutional: He is oriented to person, place, and time. He appears well-developed and well-nourished.  HENT:  Head: Normocephalic and atraumatic.  Right Ear: Tympanic membrane, external ear and  ear canal normal.  Left Ear: Tympanic membrane, external ear and ear canal normal.  Nose: Nose normal. Right sinus exhibits no maxillary sinus tenderness and no frontal sinus tenderness. Left sinus exhibits no maxillary sinus tenderness and no frontal sinus tenderness.  Mouth/Throat: Uvula is midline, oropharynx is clear and moist and mucous membranes are normal.  Eyes: Conjunctivae are normal. Pupils are equal, round, and reactive to light.  Neck: Normal range of motion.  Cardiovascular: Normal rate and regular rhythm.  Pulmonary/Chest: Effort normal and breath sounds normal. He has no wheezes.  Strong congested cough noted.   Lymphadenopathy:    He has no cervical adenopathy.  Neurological: He is alert and oriented to person, place, and time.  Skin: Skin is warm and dry.  Vitals reviewed.    UC Treatments / Results  Labs (all labs ordered are listed, but only abnormal results are displayed) Labs Reviewed - No data to display  EKG  EKG Interpretation None       Radiology No results found.  Procedures Procedures (including critical care time)  Medications  Ordered in UC Medications - No data to display   Initial Impression / Assessment and Plan / UC Course  I have reviewed the triage vital signs and the nursing notes.  Pertinent labs & imaging results that were available during my care of the patient were reviewed by me and considered in my medical decision making (see chart for details).     Benign physical findings. History and physical consistent with viral illness.  Concerning for influenza. tamiflu provided, discussed risks/benefits. Supportive cares recommended. If symptoms worsen or do not improve in the next week to return to be seen or to follow up with PCP.  Patient verbalized understanding and agreeable to plan.    Final Clinical Impressions(s) / UC Diagnoses   Final diagnoses:  Influenza-like illness    ED Discharge Orders        Ordered    oseltamivir (TAMIFLU) 75 MG capsule  Every 12 hours     10/02/17 2053       Controlled Substance Prescriptions [place] Controlled Substance Registry consulted? Not Applicable   Zigmund Gottron, NP 10/02/17 2057

## 2017-10-02 NOTE — ED Triage Notes (Addendum)
Body aches, chills, fever, fatigue

## 2017-10-02 NOTE — Discharge Instructions (Signed)
Push fluids to ensure adequate hydration and keep secretions thin.  Tylenol and/or ibuprofen as needed for pain or fevers.  May start tamiflu, may cause stomach upset.  If symptoms worsen or do not improve in the next week to return to be seen or to follow up with PCP.

## 2018-08-17 DIAGNOSIS — E291 Testicular hypofunction: Secondary | ICD-10-CM | POA: Diagnosis not present

## 2019-08-31 ENCOUNTER — Other Ambulatory Visit: Payer: Self-pay | Admitting: Otolaryngology

## 2019-11-25 ENCOUNTER — Ambulatory Visit (HOSPITAL_COMMUNITY)
Admission: EM | Admit: 2019-11-25 | Discharge: 2019-11-25 | Disposition: A | Discharge status: Home / Self Care | Payer: Commercial Managed Care - PPO | Attending: Physician Assistant | Admitting: Physician Assistant

## 2019-11-25 ENCOUNTER — Encounter (HOSPITAL_COMMUNITY): Payer: Self-pay

## 2019-11-25 ENCOUNTER — Other Ambulatory Visit: Payer: Self-pay

## 2019-11-25 DIAGNOSIS — T1591XA Foreign body on external eye, part unspecified, right eye, initial encounter: Secondary | ICD-10-CM

## 2019-11-25 MED ORDER — TETRACAINE HCL 0.5 % OP SOLN
OPHTHALMIC | Status: AC
  Filled 2019-11-25: qty 4

## 2019-11-25 MED ORDER — POLYMYXIN B-TRIMETHOPRIM 10000-0.1 UNIT/ML-% OP SOLN: 1.0000 [drp] | Freq: Four times a day (QID) | OPHTHALMIC | 0 refills | Status: AC

## 2019-11-25 MED ORDER — FLUORESCEIN SODIUM 1 MG OP STRP
ORAL_STRIP | OPHTHALMIC | Status: AC
  Filled 2019-11-25: qty 1

## 2019-11-25 NOTE — ED Triage Notes (Signed)
Patient reports eye irritation starting yesterday.

## 2019-11-25 NOTE — Discharge Instructions (Addendum)
Foreign body removed. Will cover with antibiotics given the FB and manipulation. Hope you feel better!

## 2019-11-25 NOTE — ED Provider Notes (Signed)
[place]    CSN: [id number] Arrival date & time: 11/25/19  1718      History   Chief Complaint Chief Complaint  Patient presents with  . Eye Problem    HPI Mario White is a 44 y.o. male.   Presents with "something in my eye". He was cleaning the lawnmower blade yesterday and felt something go into the eye. It has persisted despite flushing. He has no change in vision, but a feeling that it is still in there. His wife looked but did not see anything.      History reviewed. No pertinent past medical history.  There are no problems to display for this patient.   Past Surgical History:  Procedure Laterality Date  . MASS EXCISION     let shoulder benign lipoma       Home Medications    Prior to Admission medications   Medication Sig Start Date End Date Taking? Authorizing Provider  aspirin EC 81 MG tablet Take 81 mg by mouth daily.    [provider]  PRAVASTATIN SODIUM PO Take by mouth.    [provider]  trimethoprim-polymyxin b (POLYTRIM) ophthalmic solution Place 1 drop into the right eye every 6 (six) hours for 3 days. 11/25/19 11/28/19  Bjorn Pippin, PA-C    Family History Family History  Problem Relation Age of Onset  . Cancer Father        skin on face    Social History Social History   Tobacco Use  . Smoking status: Former Smoker    Quit date: 07/17/2013    Years since quitting: 6.3  . Smokeless tobacco: Never Used  Substance Use Topics  . Alcohol use: Yes    Comment: occsaional beer  . Drug use: No     Allergies   Patient has no known allergies.   Review of Systems Review of Systems  Eyes: Positive for pain and redness.  All other systems reviewed and are negative.    Physical Exam Triage Vital Signs ED Triage Vitals  Enc Vitals Group     BP 11/25/19 1734 (!) 149/86     Pulse Rate 11/25/19 1734 86     Resp 11/25/19 1734 14     Temp 11/25/19 1734 97.7 F (36.5 C)     Temp Source 11/25/19  1734 Oral     SpO2 11/25/19 1734 99 %     Weight --      Height --      Head Circumference --      Peak Flow --      Pain Score 11/25/19 1731 0     Pain Loc --      Pain Edu? --      Excl. in [place]? --    No data found.  Updated Vital Signs BP (!) 149/86 (BP Location: Right Arm)   Pulse 86   Temp 97.7 F (36.5 C) (Oral)   Resp 14   SpO2 99%   Visual Acuity Right Eye Distance:   Left Eye Distance:   Bilateral Distance:    Right Eye Near:   Left Eye Near:    Bilateral Near:     Physical Exam Vitals and nursing note reviewed.  Constitutional:      Appearance: Normal appearance.  Eyes:     General: Vision grossly intact.        Right eye: Foreign body present. No discharge or hordeolum.     Conjunctiva/sclera:  Right eye: Right conjunctiva is injected. No exudate or hemorrhage.    Comments: Right lid inverted and FB removed with immediate relief.   Pulmonary:     Effort: Pulmonary effort is normal.  Skin:    General: Skin is warm and dry.  Neurological:     Mental Status: He is alert.  Psychiatric:        Mood and Affect: Mood normal.        Behavior: Behavior normal.      UC Treatments / Results  Labs (all labs ordered are listed, but only abnormal results are displayed) Labs Reviewed - No data to display  EKG   Radiology No results found.  Procedures Procedures (including critical care time)  Medications Ordered in UC Medications - No data to display  Initial Impression / Assessment and Plan / UC Course  I have reviewed the triage vital signs and the nursing notes.  Pertinent labs & imaging results that were available during my care of the patient were reviewed by me and considered in my medical decision making (see chart for details).   FB removed. Covered with abx given duration and redness. FU if needed.    Final Clinical Impressions(s) / UC Diagnoses   Final diagnoses:  Eye foreign body, right, initial encounter     Discharge  Instructions     Foreign body removed. Will cover with antibiotics given the FB and manipulation. Hope you feel better!    ED Prescriptions    Medication Sig Dispense Auth. Provider   trimethoprim-polymyxin b (POLYTRIM) ophthalmic solution Place 1 drop into the right eye every 6 (six) hours for 3 days. 10 mL Bjorn Pippin, [place]     PDMP not reviewed this encounter.   Bjorn Pippin, [place] 11/25/19 [place]

## 2020-08-06 ENCOUNTER — Ambulatory Visit (HOSPITAL_COMMUNITY)
Admission: EM | Admit: 2020-08-06 | Discharge: 2020-08-06 | Disposition: A | Discharge status: Home / Self Care | Payer: Commercial Managed Care - PPO | Attending: Family Medicine | Admitting: Family Medicine

## 2020-08-06 ENCOUNTER — Encounter (HOSPITAL_COMMUNITY): Payer: Self-pay

## 2020-08-06 ENCOUNTER — Other Ambulatory Visit: Payer: Self-pay

## 2020-08-06 DIAGNOSIS — Z87891 Personal history of nicotine dependence: Secondary | ICD-10-CM | POA: Insufficient documentation

## 2020-08-06 DIAGNOSIS — Z20822 Contact with and (suspected) exposure to covid-19: Secondary | ICD-10-CM | POA: Diagnosis not present

## 2020-08-06 DIAGNOSIS — Z79899 Other long term (current) drug therapy: Secondary | ICD-10-CM | POA: Insufficient documentation

## 2020-08-06 DIAGNOSIS — Z7982 Long term (current) use of aspirin: Secondary | ICD-10-CM | POA: Diagnosis not present

## 2020-08-06 DIAGNOSIS — J069 Acute upper respiratory infection, unspecified: Secondary | ICD-10-CM

## 2020-08-06 DIAGNOSIS — J3489 Other specified disorders of nose and nasal sinuses: Secondary | ICD-10-CM | POA: Diagnosis present

## 2020-08-06 NOTE — ED Triage Notes (Signed)
Pt reports he may have a sinus infection. Pt states he has a pressure behind his eyes, sore throat and nasal congestion x 2 days. Pt states he took nyquil last night and dayquil at 0600.

## 2020-08-07 LAB — SARS CORONAVIRUS 2 (TAT 6-24 HRS): SARS Coronavirus 2: NEGATIVE

## 2020-08-10 NOTE — ED Provider Notes (Signed)
[facility]    CSN: [id number] Arrival date & time: 08/06/20  1300      History   Chief Complaint Chief Complaint  Patient presents with  . Sore Throat  . Nasal Congestion    HPI Mario White is a 45 y.o. male.   Here today with 2 day history of sinus pain and pressure, congestion. Denies sore throat, cough, CP, SOB, fever. Taking dayquil and nyquil with temporary relief. States he frequently gets sinus infections. No pertinent pmhx.      History reviewed. No pertinent past medical history.  There are no problems to display for this patient.   Past Surgical History:  Procedure Laterality Date  . MASS EXCISION     let shoulder benign lipoma       Home Medications    Prior to Admission medications   Medication Sig Start Date End Date Taking? Authorizing Provider  PRAVASTATIN SODIUM PO Take by mouth.   Yes [provider]  aspirin EC 81 MG tablet Take 81 mg by mouth daily.    [provider]    Family History Family History  Problem Relation Age of Onset  . Cancer Father        skin on face    Social History Social History   Tobacco Use  . Smoking status: Former Smoker    Quit date: 07/17/2013    Years since quitting: 7.0  . Smokeless tobacco: Never Used  Substance Use Topics  . Alcohol use: Yes    Comment: occsaional beer  . Drug use: No     Allergies   Patient has no known allergies.   Review of Systems Review of Systems PER HPI    Physical Exam Triage Vital Signs ED Triage Vitals  Enc Vitals Group     BP 08/06/20 1556 (!) 150/103     Pulse Rate 08/06/20 1556 91     Resp 08/06/20 1556 18     Temp 08/06/20 1556 98.3 F (36.8 C)     Temp Source 08/06/20 1556 Oral     SpO2 08/06/20 1556 99 %     Weight --      Height --      Head Circumference --      Peak Flow --      Pain Score 08/06/20 1555 0     Pain Loc --      Pain Edu? --      Excl. in [place]? --    No data found.  Updated Vital Signs BP  (!) 150/103 (BP Location: Left Arm)   Pulse 91   Temp 98.3 F (36.8 C) (Oral)   Resp 18   SpO2 99%   Visual Acuity Right Eye Distance:   Left Eye Distance:   Bilateral Distance:    Right Eye Near:   Left Eye Near:    Bilateral Near:     Physical Exam Vitals and nursing note reviewed.  Constitutional:      General: He is not in acute distress.    Appearance: Normal appearance. He is well-developed.  HENT:     Head: Atraumatic.     Right Ear: Tympanic membrane normal.     Left Ear: Tympanic membrane normal.     Nose: Rhinorrhea present.     Mouth/Throat:     Mouth: Mucous membranes are moist.     Pharynx: Oropharynx is clear.  Eyes:     Extraocular Movements: Extraocular movements intact.  Conjunctiva/sclera: Conjunctivae normal.  Cardiovascular:     Rate and Rhythm: Normal rate and regular rhythm.  Pulmonary:     Effort: Pulmonary effort is normal.     Breath sounds: Normal breath sounds.  Musculoskeletal:        General: Normal range of motion.     Cervical back: Normal range of motion and neck supple.  Skin:    General: Skin is warm and dry.  Neurological:     General: No focal deficit present.     Mental Status: He is oriented to person, place, and time.  Psychiatric:        Mood and Affect: Mood normal.        Thought Content: Thought content normal.        Judgment: Judgment normal.      UC Treatments / Results  Labs (all labs ordered are listed, but only abnormal results are displayed) Labs Reviewed  SARS CORONAVIRUS 2 (TAT 6-24 HRS)    EKG   Radiology No results found.  Procedures Procedures (including critical care time)  Medications Ordered in UC Medications - No data to display  Initial Impression / Assessment and Plan / UC Course  I have reviewed the triage vital signs and the nursing notes.  Pertinent labs & imaging results that were available during my care of the patient were reviewed by me and considered in my medical  decision making (see chart for details).     Vitals, exam reassuring today. Consistent with viral URI, treat with flonase, mucinex, sinus rinses, humidifier, rest. COVID pcr pending for r/o. Patient requesting abx, discussed no indication for this at this time. Return precautions given if not resolving.   Final Clinical Impressions(s) / UC Diagnoses   Final diagnoses:  Viral URI with cough  Sinus pressure   Discharge Instructions   None    ED Prescriptions    None     PDMP not reviewed this encounter.   Volney American, [place] 08/10/20 1120

## 2024-04-27 ENCOUNTER — Other Ambulatory Visit (HOSPITAL_BASED_OUTPATIENT_CLINIC_OR_DEPARTMENT_OTHER): Payer: Self-pay | Admitting: Family Medicine

## 2024-04-27 DIAGNOSIS — E782 Mixed hyperlipidemia: Secondary | ICD-10-CM

## 2024-05-12 ENCOUNTER — Ambulatory Visit (HOSPITAL_BASED_OUTPATIENT_CLINIC_OR_DEPARTMENT_OTHER)
Admission: RE | Admit: 2024-05-12 | Discharge: 2024-05-12 | Disposition: A | Discharge status: Home / Self Care | Payer: Self-pay | Source: Ambulatory Visit | Attending: Family Medicine | Admitting: Family Medicine

## 2024-05-12 DIAGNOSIS — E782 Mixed hyperlipidemia: Secondary | ICD-10-CM | POA: Insufficient documentation
